# Patient Record
Sex: Female | Born: 1995 | Race: White | Hispanic: No | Smoking: Never smoker
Health system: Southern US, Community
[De-identification: ages and names within clinical notes are randomized; demographics above are authoritative.]

---
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
## 2022-05-28 NOTE — L&D Delivery Note (Signed)
Delivery Note  First Stage: Labor onset: 0030 Augmentation : none Analgesia /Anesthesia intrapartum: epidural SROM at 0030  Second Stage: Complete dilation at 0613 Onset of pushing at 0615 FHR second stage 130-140bpm, moderate variability, decelerations heard with contractions with good recovery after the contraction.  Delivery of a viable female infant 04/11/2023 at 0708 by Donato Schultz, CNM. delivery of fetal head in OA position with restitution to ROA. Loose nuchal cord x 3 unwrapped with delivery of the head;  Anterior then posterior shoulders delivered easily with gentle downward traction. Baby placed on mom's chest, and attended to by peds. Terminal meconium present. Cord double clamped after cessation of pulsation, cut by FOB Cord blood sample collected   Third Stage: Placenta delivered Tomasa Blase intact with 3 VC @ 0710 Placenta disposition: discarded Uterine tone firm / bleeding scant  1st degree laceration identified  Anesthesia for repair: epidural Repair 2-0 Vicryl Est. Blood Loss (mL):  Complications: none  Mom to postpartum.  Baby to Couplet care / Skin to Skin.  Newborn: Birth Weight: TBD, infant skin-to-skin  Apgar Scores: 8, 9 Feeding planned: breastfeeding

## 2023-03-23 ENCOUNTER — Encounter: Payer: Self-pay | Admitting: Obstetrics and Gynecology

## 2023-03-23 ENCOUNTER — Observation Stay
Admission: EM | Admit: 2023-03-23 | Discharge: 2023-03-23 | Disposition: A | Discharge status: Home / Self Care | Payer: Managed Care, Other (non HMO) | Attending: Obstetrics and Gynecology | Admitting: Obstetrics and Gynecology

## 2023-03-23 ENCOUNTER — Other Ambulatory Visit: Payer: Self-pay

## 2023-03-23 DIAGNOSIS — O4703 False labor before 37 completed weeks of gestation, third trimester: Secondary | ICD-10-CM | POA: Diagnosis not present

## 2023-03-23 DIAGNOSIS — Z3A36 36 weeks gestation of pregnancy: Secondary | ICD-10-CM | POA: Diagnosis not present

## 2023-03-23 DIAGNOSIS — O26893 Other specified pregnancy related conditions, third trimester: Secondary | ICD-10-CM | POA: Diagnosis present

## 2023-03-23 DIAGNOSIS — O4193X0 Disorder of amniotic fluid and membranes, unspecified, third trimester, not applicable or unspecified: Secondary | ICD-10-CM | POA: Diagnosis not present

## 2023-03-23 DIAGNOSIS — O23593 Infection of other part of genital tract in pregnancy, third trimester: Secondary | ICD-10-CM | POA: Diagnosis present

## 2023-03-23 DIAGNOSIS — N898 Other specified noninflammatory disorders of vagina: Principal | ICD-10-CM | POA: Diagnosis present

## 2023-03-23 LAB — WET PREP, GENITAL
Clue Cells Wet Prep HPF POC: NONE SEEN
Sperm: NONE SEEN
Trich, Wet Prep: NONE SEEN
WBC, Wet Prep HPF POC: <10 (ref <10)

## 2023-03-23 LAB — RUPTURE OF MEMBRANE (ROM)PLUS: Rom Plus: NEGATIVE

## 2023-03-23 MED ORDER — FLUCONAZOLE 50 MG PO TABS
150.0000 mg | ORAL_TABLET | Freq: Once | ORAL | Status: AC
  Administered 2023-03-23: 150 mg via ORAL
  Filled 2023-03-23: qty 1

## 2023-03-23 MED ORDER — CALCIUM CARBONATE ANTACID 500 MG PO CHEW: 2.0000 | CHEWABLE_TABLET | ORAL | Status: DC | PRN

## 2023-03-23 MED ORDER — ACETAMINOPHEN 325 MG PO TABS: 650.0000 mg | ORAL_TABLET | ORAL | Status: DC | PRN

## 2023-03-23 NOTE — Discharge Instructions (Signed)
LABOR: When contractions begin, you should start to time them from the beginning of one contraction to the beginning of the next.  When contractions are 5-10 minutes apart or less and have been regular for at least an hour, you should call your health care provider. ° °Notify your doctor if any of the following occur: °1. Bleeding from the vagina 7. Sudden, constant, or occasional abdominal pain  °2. Pain or burning when urinating 8. Sudden gushing of fluid from the vagina (with or without continued leaking)  °3. Chills or fever 9. Fainting spells, "black outs" or loss of consciousness  °4. Increase in vaginal discharge 10. Severe or continued nausea or vomiting  °5. Pelvic pressure (sudden increase) 11. Blurring of vision or spots before the eyes  °6. Baby moving less than usual 12. Leaking of fluid  ° ° °FETAL KICK COUNT: °Lie on your left side for one hour after a meal, and count the number of times your baby kicks. If it is less than 5 times, get up, move around and drink some juice. Repeat the test 30 minutes later. If it is still less than 5 kicks in an hour, notify your doctor. °

## 2023-03-23 NOTE — OB Triage Note (Signed)
Patient is a G2P0, [redacted]w[redacted]d per patient. Patient presents with complaint of leaking of fluid. Patient states that she was at [employer] and there was leaking fluid and her pants were wet. Patient states no bleeding. Patient feels some cramping but denies ctx. Patient reports +FM. VSS. Monitors applied and assessing.

## 2023-03-23 NOTE — Discharge Summary (Signed)
Krystal Torres is a 27 y.o. female. She is at [redacted]w[redacted]d gestation. No LMP recorded. Patient is pregnant. Estimated Date of Delivery: 04/17/23  Prenatal care site: [facility]    Current pregnancy complicated by:  - transferred to [facility] @ 36wks - Anemia  Chief complaint: vaginal discharge, possible SROM  She reports this evening she noticed leaking fluid and her pants were wet. She reports occasional mild contractions. She did not feel a "pop" with the leaking fluid and she is not continuing to leak fluid.  S: Resting comfortably. no CTX, no VB.no LOF,  Active fetal movement.  Denies: HA, visual changes, SOB, or RUQ/epigastric pain  Maternal Medical History:  History reviewed. No pertinent past medical history.  History reviewed. No pertinent surgical history.  No Known Allergies  Prior to Admission medications   Medication Sig Start Date End Date Taking? Authorizing Provider  HYDROcodone-acetaminophen (NORCO/VICODIN) 5-325 MG per tablet Take 1 tablet by mouth every 4 (four) hours as needed. Patient not taking: Reported on 03/23/2023 12/26/13   Elpidio Anis, PA-C  ibuprofen (ADVIL,MOTRIN) 600 MG tablet Take 1 tablet (600 mg total) by mouth every 6 (six) hours as needed. Patient not taking: Reported on 03/23/2023 12/26/13   Elpidio Anis, PA-C    Social History: She  reports that she has never smoked. She does not have any smokeless tobacco history on file. She reports that she does not drink alcohol and does not use drugs.  Family History: family history is not on file.  no history of gyn cancers  Review of Systems: A full review of systems was performed and negative except as noted in the HPI.     O:  BP 123/75 (BP Location: Left Arm)   Pulse (!) 121   Temp 98.2 F (36.8 C) (Oral)   Resp 16   Ht 5\' 7"  (1.702 m)   Wt 74.8 kg   BMI 25.84 kg/m  Results for orders placed or performed during the hospital encounter of 03/23/23 (from the past 48 hour(s))  Wet prep,  genital   Collection Time: 03/23/23  4:14 PM   Specimen: Vaginal  Result Value Ref Range   Yeast Wet Prep HPF POC PRESENT (A) NONE SEEN   Trich, Wet Prep NONE SEEN NONE SEEN   Clue Cells Wet Prep HPF POC NONE SEEN NONE SEEN   WBC, Wet Prep HPF POC <10 <10   Sperm NONE SEEN   Rupture of Membrane (ROM) Plus   Collection Time: 03/23/23  4:14 PM  Result Value Ref Range   Rom Plus NEGATIVE      Constitutional: NAD, AAOx3  HE/ENT: extraocular movements grossly intact, moist mucous membranes CV: RRR PULM: nl respiratory effort, CTABL     Abd: gravid, non-tender, non-distended, soft      Ext: Non-tender, Nonedematous   Psych: mood appropriate, speech normal Pelvic: deferred   Fetal  monitoring: Cat 1 Appropriate for GA Baseline: 130bpm Variability: moderate Accelerations: present x >2 Decelerations absent Contractions q3-17min, palpate mild  A/P: 27 y.o. [redacted]w[redacted]d here for antenatal surveillance for vaginal discharge  Principle Diagnosis:  Yeast infection  SROM: not present. ROM Plus negative. Reviewed s/s of SROM and when to return to the hospital. Vaginal yeast infection present. Treat with Diflucan 150mg  PO x 1. Fetal Wellbeing: Reassuring Cat 1 tracing. Reactive NST  D/c home stable, precautions reviewed, follow-up as scheduled.    Janyce Llanos, CNM 03/23/2023 5:34 PM

## 2023-04-11 ENCOUNTER — Inpatient Hospital Stay
Admission: EM | Admit: 2023-04-11 | Discharge: 2023-04-12 | DRG: 807 | Disposition: A | Discharge status: Home / Self Care | Payer: Managed Care, Other (non HMO) | Attending: Obstetrics | Admitting: Obstetrics

## 2023-04-11 ENCOUNTER — Encounter: Payer: Self-pay | Admitting: Obstetrics and Gynecology

## 2023-04-11 ENCOUNTER — Inpatient Hospital Stay: Payer: Managed Care, Other (non HMO) | Admitting: Anesthesiology

## 2023-04-11 ENCOUNTER — Other Ambulatory Visit: Payer: Self-pay

## 2023-04-11 DIAGNOSIS — O4292 Full-term premature rupture of membranes, unspecified as to length of time between rupture and onset of labor: Principal | ICD-10-CM | POA: Diagnosis present

## 2023-04-11 DIAGNOSIS — Z6791 Unspecified blood type, Rh negative: Secondary | ICD-10-CM | POA: Diagnosis not present

## 2023-04-11 DIAGNOSIS — O26899 Other specified pregnancy related conditions, unspecified trimester: Secondary | ICD-10-CM

## 2023-04-11 DIAGNOSIS — B951 Streptococcus, group B, as the cause of diseases classified elsewhere: Secondary | ICD-10-CM

## 2023-04-11 DIAGNOSIS — O4290 Premature rupture of membranes, unspecified as to length of time between rupture and onset of labor, unspecified weeks of gestation: Secondary | ICD-10-CM | POA: Diagnosis present

## 2023-04-11 DIAGNOSIS — Z3A39 39 weeks gestation of pregnancy: Secondary | ICD-10-CM | POA: Diagnosis not present

## 2023-04-11 DIAGNOSIS — Z23 Encounter for immunization: Secondary | ICD-10-CM

## 2023-04-11 DIAGNOSIS — O9902 Anemia complicating childbirth: Secondary | ICD-10-CM | POA: Diagnosis present

## 2023-04-11 DIAGNOSIS — O99824 Streptococcus B carrier state complicating childbirth: Secondary | ICD-10-CM | POA: Diagnosis present

## 2023-04-11 DIAGNOSIS — O99019 Anemia complicating pregnancy, unspecified trimester: Secondary | ICD-10-CM

## 2023-04-11 DIAGNOSIS — O26893 Other specified pregnancy related conditions, third trimester: Secondary | ICD-10-CM | POA: Diagnosis present

## 2023-04-11 LAB — COMPREHENSIVE METABOLIC PANEL
ALT: 13 U/L (ref 0–44)
AST: 22 U/L (ref 15–41)
Albumin: 2.6 g/dL — ABNORMAL LOW (ref 3.5–5.0)
Alkaline Phosphatase: 162 U/L — ABNORMAL HIGH (ref 38–126)
Anion gap: 9 (ref 5–15)
BUN: 8 mg/dL (ref 6–20)
CO2: 20 mmol/L — ABNORMAL LOW (ref 22–32)
Calcium: 8.8 mg/dL — ABNORMAL LOW (ref 8.9–10.3)
Chloride: 106 mmol/L (ref 98–111)
Creatinine, Ser: 0.57 mg/dL (ref 0.44–1.00)
GFR, Estimated: >60 mL/min (ref >60)
Glucose, Bld: 112 mg/dL — ABNORMAL HIGH (ref 70–99)
Potassium: 3.2 mmol/L — ABNORMAL LOW (ref 3.5–5.1)
Sodium: 135 mmol/L (ref 135–145)
Total Bilirubin: 0.4 mg/dL (ref <1.2)
Total Protein: 6.1 g/dL — ABNORMAL LOW (ref 6.5–8.1)

## 2023-04-11 LAB — CBC
HCT: 32.5 % — ABNORMAL LOW (ref 36.0–46.0)
Hemoglobin: 10.4 g/dL — ABNORMAL LOW (ref 12.0–15.0)
MCH: 25.6 pg — ABNORMAL LOW (ref 26.0–34.0)
MCHC: 32 g/dL (ref 30.0–36.0)
MCV: 80 fL (ref 80.0–100.0)
Platelets: 397 10*3/uL (ref 150–400)
RBC: 4.06 MIL/uL (ref 3.87–5.11)
RDW: 15 % (ref 11.5–15.5)
WBC: 10.4 10*3/uL (ref 4.0–10.5)
nRBC: 0 % (ref 0.0–0.2)

## 2023-04-11 LAB — PROTEIN / CREATININE RATIO, URINE
Creatinine, Urine: 24 mg/dL
Creatinine, Urine: 51 mg/dL
Protein Creatinine Ratio: 1.54 mg/mg{creat} — ABNORMAL HIGH (ref 0.00–0.15)
Total Protein, Urine: 37 mg/dL
Total Protein, Urine: <6 mg/dL

## 2023-04-11 LAB — TYPE AND SCREEN
ABO/RH(D): O NEG
Antibody Screen: NEGATIVE

## 2023-04-11 LAB — RPR: RPR Ser Ql: NONREACTIVE

## 2023-04-11 LAB — ABO/RH: ABO/RH(D): O NEG

## 2023-04-11 MED ORDER — POTASSIUM CHLORIDE 20 MEQ PO PACK
20.0000 meq | PACK | Freq: Two times a day (BID) | ORAL | Status: DC
  Administered 2023-04-11 – 2023-04-12 (×2): 20 meq via ORAL
  Filled 2023-04-11 (×2): qty 1

## 2023-04-11 MED ORDER — ZOLPIDEM TARTRATE 5 MG PO TABS: 5.0000 mg | ORAL_TABLET | Freq: Every evening | ORAL | Status: DC | PRN

## 2023-04-11 MED ORDER — EPHEDRINE 5 MG/ML INJ: 10.0000 mg | INTRAVENOUS | Status: DC | PRN

## 2023-04-11 MED ORDER — OXYTOCIN 10 UNIT/ML IJ SOLN
INTRAMUSCULAR | Status: AC
  Filled 2023-04-11: qty 2

## 2023-04-11 MED ORDER — LIDOCAINE HCL (PF) 1 % IJ SOLN: 30.0000 mL | INTRAMUSCULAR | Status: DC | PRN

## 2023-04-11 MED ORDER — FENTANYL-BUPIVACAINE-NACL 0.5-0.125-0.9 MG/250ML-% EP SOLN
EPIDURAL | Status: AC
  Filled 2023-04-11: qty 250

## 2023-04-11 MED ORDER — LACTATED RINGERS IV SOLN: 500.0000 mL | INTRAVENOUS | Status: DC | PRN

## 2023-04-11 MED ORDER — OXYTOCIN-SODIUM CHLORIDE 30-0.9 UT/500ML-% IV SOLN
2.5000 [IU]/h | INTRAVENOUS | Status: DC
  Administered 2023-04-11: 2.5 [IU]/h via INTRAVENOUS
  Filled 2023-04-11: qty 500

## 2023-04-11 MED ORDER — DIBUCAINE (PERIANAL) 1 % EX OINT
1.0000 | TOPICAL_OINTMENT | CUTANEOUS | Status: DC | PRN
  Administered 2023-04-11: 1 via RECTAL
  Filled 2023-04-11: qty 28

## 2023-04-11 MED ORDER — PRENATAL MULTIVITAMIN CH
1.0000 | ORAL_TABLET | Freq: Every day | ORAL | Status: DC
  Administered 2023-04-11 – 2023-04-12 (×2): 1 via ORAL
  Filled 2023-04-11 (×2): qty 1

## 2023-04-11 MED ORDER — PHENYLEPHRINE 80 MCG/ML (10ML) SYRINGE FOR IV PUSH (FOR BLOOD PRESSURE SUPPORT): 80.0000 ug | PREFILLED_SYRINGE | INTRAVENOUS | Status: DC | PRN

## 2023-04-11 MED ORDER — SIMETHICONE 80 MG PO CHEW: 80.0000 mg | CHEWABLE_TABLET | ORAL | Status: DC | PRN

## 2023-04-11 MED ORDER — SODIUM CHLORIDE 0.9 % IV SOLN
INTRAVENOUS | Status: AC
  Filled 2023-04-11: qty 2000

## 2023-04-11 MED ORDER — WITCH HAZEL-GLYCERIN EX PADS
1.0000 | MEDICATED_PAD | CUTANEOUS | Status: DC | PRN
  Administered 2023-04-11: 1 via TOPICAL
  Filled 2023-04-11: qty 100

## 2023-04-11 MED ORDER — COCONUT OIL OIL: 1.0000 | TOPICAL_OIL | Status: DC | PRN

## 2023-04-11 MED ORDER — ACETAMINOPHEN 325 MG PO TABS: 650.0000 mg | ORAL_TABLET | ORAL | Status: DC | PRN

## 2023-04-11 MED ORDER — SOD CITRATE-CITRIC ACID 500-334 MG/5ML PO SOLN: 30.0000 mL | ORAL | Status: DC | PRN

## 2023-04-11 MED ORDER — DIPHENHYDRAMINE HCL 50 MG/ML IJ SOLN: 12.5000 mg | INTRAMUSCULAR | Status: DC | PRN

## 2023-04-11 MED ORDER — ONDANSETRON HCL 4 MG/2ML IJ SOLN: 4.0000 mg | Freq: Four times a day (QID) | INTRAMUSCULAR | Status: DC | PRN

## 2023-04-11 MED ORDER — AMMONIA AROMATIC IN INHA
RESPIRATORY_TRACT | Status: AC
  Filled 2023-04-11: qty 10

## 2023-04-11 MED ORDER — LACTATED RINGERS IV SOLN: INTRAVENOUS | Status: DC

## 2023-04-11 MED ORDER — FENTANYL-BUPIVACAINE-NACL 0.5-0.125-0.9 MG/250ML-% EP SOLN
12.0000 mL/h | EPIDURAL | Status: DC | PRN
Start: 2023-04-11 — End: 2023-04-11
  Administered 2023-04-11: 12 mL/h via EPIDURAL

## 2023-04-11 MED ORDER — LIDOCAINE HCL (PF) 1 % IJ SOLN
INTRAMUSCULAR | Status: AC
  Filled 2023-04-11: qty 30

## 2023-04-11 MED ORDER — FENTANYL CITRATE (PF) 100 MCG/2ML IJ SOLN: 50.0000 ug | INTRAMUSCULAR | Status: DC | PRN

## 2023-04-11 MED ORDER — SODIUM CHLORIDE 0.9 % IV SOLN
5.0000 10*6.[IU] | Freq: Once | INTRAVENOUS | Status: DC
  Filled 2023-04-11: qty 5

## 2023-04-11 MED ORDER — ONDANSETRON HCL 4 MG/2ML IJ SOLN: 4.0000 mg | INTRAMUSCULAR | Status: DC | PRN

## 2023-04-11 MED ORDER — MISOPROSTOL 200 MCG PO TABS
ORAL_TABLET | ORAL | Status: AC
  Filled 2023-04-11: qty 4

## 2023-04-11 MED ORDER — BENZOCAINE-MENTHOL 20-0.5 % EX AERO
1.0000 | INHALATION_SPRAY | CUTANEOUS | Status: DC | PRN
  Administered 2023-04-11: 1 via TOPICAL
  Filled 2023-04-11: qty 56

## 2023-04-11 MED ORDER — ONDANSETRON HCL 4 MG PO TABS: 4.0000 mg | ORAL_TABLET | ORAL | Status: DC | PRN

## 2023-04-11 MED ORDER — LACTATED RINGERS IV SOLN: 500.0000 mL | Freq: Once | INTRAVENOUS | Status: DC

## 2023-04-11 MED ORDER — SODIUM CHLORIDE 0.9 % IV SOLN: 2.0000 g | Freq: Four times a day (QID) | INTRAVENOUS | Status: DC

## 2023-04-11 MED ORDER — SENNOSIDES-DOCUSATE SODIUM 8.6-50 MG PO TABS
2.0000 | ORAL_TABLET | Freq: Every day | ORAL | Status: DC
  Administered 2023-04-12: 2 via ORAL
  Filled 2023-04-11: qty 2

## 2023-04-11 MED ORDER — LIDOCAINE HCL (PF) 1 % IJ SOLN
INTRAMUSCULAR | Status: DC | PRN
  Administered 2023-04-11: 1 mL via SUBCUTANEOUS

## 2023-04-11 MED ORDER — LIDOCAINE-EPINEPHRINE (PF) 1.5 %-1:200000 IJ SOLN
INTRAMUSCULAR | Status: DC | PRN
  Administered 2023-04-11: 3 mL via EPIDURAL

## 2023-04-11 MED ORDER — OXYTOCIN BOLUS FROM INFUSION
333.0000 mL | Freq: Once | INTRAVENOUS | Status: AC
  Administered 2023-04-11: 333 mL via INTRAVENOUS

## 2023-04-11 MED ORDER — IBUPROFEN 600 MG PO TABS
600.0000 mg | ORAL_TABLET | Freq: Four times a day (QID) | ORAL | Status: DC
  Administered 2023-04-11 – 2023-04-12 (×5): 600 mg via ORAL
  Filled 2023-04-11 (×5): qty 1

## 2023-04-11 MED ORDER — PENICILLIN G POT IN DEXTROSE 60000 UNIT/ML IV SOLN
3.0000 10*6.[IU] | INTRAVENOUS | Status: DC
Start: 2023-04-11 — End: 2023-04-11

## 2023-04-11 MED ORDER — DIPHENHYDRAMINE HCL 25 MG PO CAPS: 25.0000 mg | ORAL_CAPSULE | Freq: Four times a day (QID) | ORAL | Status: DC | PRN

## 2023-04-11 NOTE — Progress Notes (Signed)
Repeat P/C ratio ordered since first sample was not straight cath. Result reported to [facility]. Vitals WNL.

## 2023-04-11 NOTE — Lactation Note (Signed)
This note was copied from a baby's chart. Lactation Consultation Note  Patient Name: Boy Delvia Asano [id number] Date: 04/11/2023 Age:25 hours Reason for consult: Initial assessment;Primapara;Term   Maternal Data Has patient been taught Hand Expression?: No Does the patient have breastfeeding experience prior to this delivery?: No  Lactation to room for an initial assessment w/ a P1 patient and 7hr old baby boy.  This was SVD.  Feeding goal is breastfeeding.    Patient stated that she has a Medela  Pump in Style at home.  Per patient, she feels feedings are going well. She does not feel comfortable with the football hold and states cradle hold she feels more comfortable with.  Feeding Mother's Current Feeding Choice: Breast Milk  Upon entry into room patient was breastfeeding infant on the rt breast.  Patient stated that she has not felt any pain.  During this time patient discussed how lips should look at the breast and how infants position should look, tummy to tummy. Patient verbalized understanding.  LATCH Score Latch: Grasps breast easily, tongue down, lips flanged, rhythmical sucking.  Audible Swallowing: A few with stimulation  Type of Nipple: Everted at rest and after stimulation  Comfort (Breast/Nipple): Soft / non-tender  Hold (Positioning): No assistance needed to correctly position infant at breast.  LATCH Score: 9  Interventions Interventions: Breast feeding basics reviewed;Assisted with latch;Education  LC provided education on the following;  milk production expectations, hunger cues, day 1/2 wet/dirty diapers, hand expression, benefits of STS and arousing infant for a feeding.  Lactation informed patient of feeding infant at least 8 or more times w/in a 24hr period but not exceeding 3hrs. Patient verbalized understanding.     Discharge Pump: Personal;Hands Argusta Mcgann (Medela Pump in Style)  Consult Status Consult Status: Follow-up Follow-up type:  In-patient    Yvette Rack Zyair Rhein 04/11/2023, 4:25 PM

## 2023-04-11 NOTE — Anesthesia Procedure Notes (Signed)
Epidural Patient location during procedure: OB Start time: 04/11/2023 2:32 AM End time: 04/11/2023 2:46 AM  Staffing Anesthesiologist: Foye Deer, MD Performed: anesthesiologist   Preanesthetic Checklist Completed: patient identified, IV checked, site marked, risks and benefits discussed, surgical consent, monitors and equipment checked, pre-op evaluation and timeout performed  Epidural Patient position: sitting Prep: ChloraPrep Patient monitoring: heart rate, continuous pulse ox and blood pressure Approach: midline Location: L3-L4 Injection technique: LOR saline  Needle:  Needle type: Tuohy  Needle gauge: 18 G Needle length: 9 cm Needle insertion depth: 5 cm Catheter type: closed end Catheter size: 20 Guage Catheter at skin depth: 10 cm Test dose: negative and 1.5% lidocaine with Epi 1:200 K  Assessment Events: blood not aspirated, no cerebrospinal fluid, injection not painful, no injection resistance and no paresthesia  Additional Notes Reason for block:procedure for pain

## 2023-04-11 NOTE — Progress Notes (Signed)
Mild range blood pressures, no headaches, changes of vision, or RUQ pain.  Vitals:   04/11/23 0247 04/11/23 0257 04/11/23 0259 04/11/23 0301  BP: (!) 140/93 135/81 127/89 129/81   04/11/23 0303 04/11/23 0311 04/11/23 0313 04/11/23 0315  BP: 121/77 127/89 (!) 142/125 129/84   04/11/23 0317 04/11/23 0321 04/11/23 0326 04/11/23 0328  BP: 118/86 (!) 134/91 (!) 142/48 30/54   27 year old G2P0010, NSVD @ 0708, has had several mild range blood pressures  Ordered PIH labs, CBC WNL (platelets 397), CMP and P/C ratio added. Will continue monitoring to determine if gHTN or pre-eclampsia or if needs antihypertensive medications.  Janyce Llanos, CNM 04/11/2023 8:09 AM

## 2023-04-11 NOTE — H&P (Signed)
OB History & Physical   History of Present Illness:  Chief Complaint:   HPI:  Krystal Torres is a 27 y.o. G73P0010 female at [redacted]w[redacted]d dated by LMP c/w [redacted]w[redacted]d [place].  She presents to L&D for SROM @ 0030 and uterine contractions. She started noticing contractions that started right around the time her water broke.  She endorses good fetal movement.    Pregnancy Issues: 1. Transferred to [facility] @ 36wks 2. Anemia 3. GBS positive 4. Rh negative   Maternal Medical History:  History reviewed. No pertinent past medical history.  History reviewed. No pertinent surgical history.  No Known Allergies  Prior to Admission medications   Medication Sig Start Date End Date Taking? Authorizing Provider  ferrous sulfate 325 (65 FE) MG tablet Take 325 mg by mouth daily with breakfast.   Yes [provider]  HYDROcodone-acetaminophen (NORCO/VICODIN) 5-325 MG per tablet Take 1 tablet by mouth every 4 (four) hours as needed. Patient not taking: Reported on 03/23/2023 12/26/13   Elpidio Anis, PA-C  ibuprofen (ADVIL,MOTRIN) 600 MG tablet Take 1 tablet (600 mg total) by mouth every 6 (six) hours as needed. Patient not taking: Reported on 03/23/2023 12/26/13   Elpidio Anis, PA-C     Prenatal care site: [facility]    Social History: She  reports that she has never smoked. She does not have any smokeless tobacco history on file. She reports that she does not drink alcohol and does not use drugs.  Family History: family history is not on file.   Review of Systems: A full review of systems was performed and negative except as noted in the HPI.     Physical Exam:  Vital Signs: BP (!) 145/95 (BP Location: Right Arm)   Pulse 91   Temp 97.9 F (36.6 C) (Oral)   Resp 16   Ht 5\' 7"  (1.702 m)   Wt 76.7 kg   BMI 26.47 kg/m  General: no acute distress.  HEENT: normocephalic, atraumatic Heart: regular rate & rhythm.  No murmurs/rubs/gallops Lungs: clear to auscultation bilaterally, normal  respiratory effort Abdomen: soft, gravid, non-tender;  EFW: 7.5lb Pelvic:   External: Normal external female genitalia  Cervix: Dilation: 4 / Effacement (%): 80 / Station: 0    Extremities: non-tender, symmetric, mild edema bilaterally.  DTRs: +2  Neurologic: Alert & oriented x 3.    No results found for this or any previous visit (from the past 24 hour(s)).  Pertinent Results:  Prenatal Labs: Blood type/Rh O negative  Antibody screen neg  Rubella Immune  Varicella Not evaluated prenatally  RPR NR  HBsAg Neg  HIV NR  GC neg  Chlamydia neg  Genetic screening negative  1 hour GTT 99  3 hour GTT N/a  GBS positive   FHT: 130bpm, moderate variability, accelerations present, no decelerations, Category I tracing TOCO: uterine contractions q2-75min, palpate mild SVE:  Dilation: 4 / Effacement (%): 80 / Station: 0    Cephalic by leopolds  No results found.  Assessment:  Krystal Torres is a 27 y.o. G37P0010 female at [redacted]w[redacted]d with SROM and uterine contractions.   Plan:  1. Admit to Labor & Delivery; consents reviewed and obtained - Dr. Jean Rosenthal notified of admission  2. Fetal Well being  - Fetal Tracing: Category I tracing - Group B Streptococcus ppx indicated: GBS positive and SROM, begin treatment at this time - Presentation: vertex confirmed by SVE   3. Routine OB: - Prenatal labs reviewed, as above - Rh negative,  will check infant cord blood after delivery - CBC, T&S, RPR on admit - Clear fluids, IVF   4. Monitoring of Labor  -  Contractions q2-61min, external toco in place -  Pelvis adequate for trial of labor -  Plan for continuous fetal monitoring  -  Maternal pain control as desired; requesting regional anesthesia - Anticipate vaginal delivery  5. Post Partum Planning: - Infant feeding: breastfeeding - Contraception: Liletta IUD - Tdap: received 28wks - Flu: declined - RSV: received 03/20/23  Janyce Llanos, CNM 04/11/23 1:43 AM

## 2023-04-11 NOTE — OB Triage Note (Signed)
Krystal Torres 27 y.o. @[redacted]w[redacted]d  G2P0  presents to Labor & Delivery triage via wheelchair steered by ED staff reporting leaking of fluid and contractions starting at 0030. Reports fluid was clear with slight pink color. Pt is grossly ruptured. She endorses positive fetal movement. External FM and TOCO applied to non-tender abdomen. Initial FHR 130. Vital signs obtained and BP slightly elevated. Patient oriented to care environment including call bell and bed control use. Donato Schultz, CNM notified of patient's arrival. Plan to admit.

## 2023-04-11 NOTE — Progress Notes (Signed)
Labor Progress Note  Krystal Torres is a 27 y.o. G2P0010 at [redacted]w[redacted]d by LMP admitted for active labor, PROM  Subjective: she reports feeling well, slight rectal pressure  Objective: BP 127/85   Pulse 76   Temp 97.9 F (36.6 C) (Oral)   Resp 16   Ht 5\' 7"  (1.702 m)   Wt 76.7 kg   SpO2 97%   BMI 26.47 kg/m  Notable VS details: reviewed  Fetal Assessment: FHT:  FHR: 125 bpm, variability: moderate,  accelerations:  Present,  decelerations:  Absent Category/reactivity:  Category I UC:   regular, every 1.5-3 minutes SVE:    Dilation: 10cm  Effacement: 100%  Station:  +2  Consistency: soft  Position: anterior  Membrane status:SROM @ 0030 Amniotic color: clear  Labs: Lab Results  Component Value Date   WBC 10.4 04/11/2023   HGB 10.4 (L) 04/11/2023   HCT 32.5 (L) 04/11/2023   MCV 80.0 04/11/2023   PLT 397 04/11/2023    Assessment / Plan: Spontaneous labor, progressing normally  Labor:  10cm, beginning second stage, pushing with good maternal effort, fetal head visible on perineum with pushing efforts Preeclampsia:  labs stable Fetal Wellbeing:  Category I Pain Control:  Epidural I/D:   GBS positive, treated with ampicillin x 2 Anticipated MOD:  NSVD  Janyce Llanos, CNM 04/11/2023, 6:17 AM

## 2023-04-11 NOTE — Anesthesia Preprocedure Evaluation (Signed)
Anesthesia Evaluation  Patient identified by MRN, date of birth, ID band Patient awake    Reviewed: Allergy & Precautions, H&P , NPO status , Patient's Chart, lab work & pertinent test results  Airway Mallampati: II  TM Distance: >3 FB Neck ROM: full    Dental no notable dental hx.    Pulmonary neg pulmonary ROS   Pulmonary exam normal        Cardiovascular Exercise Tolerance: Good negative cardio ROS Normal cardiovascular exam     Neuro/Psych    GI/Hepatic negative GI ROS,,,  Endo/Other    Renal/GU   negative genitourinary   Musculoskeletal   Abdominal   Peds  Hematology negative hematology ROS (+)   Anesthesia Other Findings  BMI    Body Mass Index: 26.47 kg/m      Reproductive/Obstetrics (+) Pregnancy                              Anesthesia Physical Anesthesia Plan  ASA: 2  Anesthesia Plan: Epidural   Post-op Pain Management:    Induction:   PONV Risk Score and Plan:   Airway Management Planned:   Additional Equipment:   Intra-op Plan:   Post-operative Plan:   Informed Consent: I have reviewed the patients History and Physical, chart, labs and discussed the procedure including the risks, benefits and alternatives for the proposed anesthesia with the patient or authorized representative who has indicated his/her understanding and acceptance.       Plan Discussed with: Anesthesiologist and CRNA  Anesthesia Plan Comments:          Anesthesia Quick Evaluation

## 2023-04-11 NOTE — Discharge Summary (Deleted)
Postpartum Discharge Summary  Patient Name: Krystal Torres DOB: [birth year] MRN: [id number]  Date of admission: 04/11/2023 Delivery date:04/11/2023 Delivering provider: Donato Schultz RENEE Date of discharge: 04/12/2023  Primary OB: [facility] LMP:No LMP recorded. EDC Estimated Date of Delivery: 04/17/23 Gestational Age at Delivery: [redacted]w[redacted]d   Admitting diagnosis: Amniotic fluid leaking [O42.90] Intrauterine pregnancy: [redacted]w[redacted]d     Secondary diagnosis:   Principal Problem:   NSVD (normal spontaneous vaginal delivery) Active Problems:   Amniotic fluid leaking   Anemia affecting pregnancy   Rh negative state in antepartum period   Positive GBS test   Discharge Diagnosis: Term Pregnancy Delivered                                                Post partum procedures: None  Augmentation:: N/A Complications: None Delivery Type: spontaneous vaginal delivery Anesthesia: epidural anesthesia Placenta: spontaneous To Pathology: No  Laceration: 1st degree Episiotomy: none  Prenatal Labs:  Blood type/Rh O negative  Antibody screen neg  Rubella Immune  Varicella Not evaluated prenatally  RPR NR  HBsAg Neg  HIV NR  GC neg  Chlamydia neg  Genetic screening negative  1 hour GTT 99  3 hour GTT N/a  GBS positive    Hospital course: Onset of Labor With Vaginal Delivery      27 y.o. yo G2P1011 at [redacted]w[redacted]d was admitted in Active Labor on 04/11/2023. Labor course was uncomplicated. She progressed to 10/100/+2 and pushed 1 hour, delivering viable female infant over 1st degree laceration. Apgars 8/9.  Membrane Rupture Time/Date: 12:30 AM,04/11/2023  Delivery Method:Vaginal, Spontaneous Operative Delivery:N/A Episiotomy: None Lacerations:  1st degree Patient had an uncomplicated postpartum course. She is ambulating, tolerating a regular diet, passing flatus, and urinating well. Patient is discharged home in stable condition on 04/12/23.  Newborn Data: "Montez Morita" Birth  date:04/11/2023 Birth time:7:08 AM Gender:Female Living status:Living Apgars:8 ,9  Weight:3970 g  Magnesium Sulfate received: No BMZ received: No Rhophylac:Yes; ordered by CNM prior to discharge  MMR:No Varivax vaccine given: was not indicated T-DaP:Given prenatally Flu: N/A  Transfusion:No  Physical exam  Vitals:   04/11/23 1600 04/11/23 1944 04/11/23 2321 04/12/23 0809  BP: 118/86 121/86 115/80 106/71  Pulse: 88 86 91 100  Resp: 20 20 20    Temp: 98 F (36.7 C) 98.4 F (36.9 C) 98.3 F (36.8 C) 98.7 F (37.1 C)  TempSrc: Oral Oral Oral Oral  SpO2: 99% 99% 99% 99%  Weight:      Height:       General: alert, cooperative, and no distress Lochia: appropriate Uterine Fundus: firm Perineum:minimal edema/repair well approximated DVT Evaluation: No evidence of DVT seen on physical exam.  Labs: Lab Results  Component Value Date   WBC 10.4 04/12/2023   HGB 9.2 (L) 04/12/2023   HCT 28.9 (L) 04/12/2023   MCV 83.0 04/12/2023   PLT 285 04/12/2023      Latest Ref Rng & Units 04/11/2023    8:28 AM  CMP  Glucose 70 - 99 mg/dL 756   BUN 6 - 20 mg/dL 8   Creatinine 4.33 - 2.95 mg/dL 1.88   Sodium 416 - 606 mmol/L 135   Potassium 3.5 - 5.1 mmol/L 3.2   Chloride 98 - 111 mmol/L 106   CO2 22 - 32 mmol/L 20   Calcium 8.9 - 10.3 mg/dL 8.8   Total  Protein 6.5 - 8.1 g/dL 6.1   Total Bilirubin <4.0 mg/dL 0.4   Alkaline Phos 38 - 126 U/L 162   AST 15 - 41 U/L 22   ALT 0 - 44 U/L 13    Edinburgh Score:    04/11/2023    5:24 PM  Edinburgh Postnatal Depression Scale Screening Tool  I have been able to laugh and see the funny side of things. 0  I have looked forward with enjoyment to things. 0  I have blamed myself unnecessarily when things went wrong. 0  I have been anxious or worried for no good reason. 0  I have felt scared or panicky for no good reason. 0  Things have been getting on top of me. 0  I have been so unhappy that I have had difficulty sleeping. 0  I have  felt sad or miserable. 0  I have been so unhappy that I have been crying. 0  The thought of harming myself has occurred to me. 0  Edinburgh Postnatal Depression Scale Total 0    Risk assessment for postpartum VTE and prophylactic treatment: Very high risk factors: None High risk factors: None Moderate risk factors: None  Postpartum VTE prophylaxis with LMWH not indicated  After visit meds:  Allergies as of 04/12/2023   No Known Allergies      Medication List     STOP taking these medications    HYDROcodone-acetaminophen 5-325 MG tablet Commonly known as: NORCO/VICODIN       TAKE these medications    benzocaine-Menthol 20-0.5 % Aero Commonly known as: DERMOPLAST Apply 1 Application topically as needed for irritation (perineal discomfort).   coconut oil Oil Apply 1 Application topically as needed.   dibucaine 1 % Oint Commonly known as: NUPERCAINAL Place 1 Application rectally as needed for hemorrhoids.   ferrous sulfate 325 (65 FE) MG tablet Take 325 mg by mouth daily with breakfast.   ibuprofen 600 MG tablet Commonly known as: ADVIL Take 1 tablet (600 mg total) by mouth every 6 (six) hours. What changed:  when to take this reasons to take this   prenatal multivitamin Tabs tablet Take 1 tablet by mouth daily at 12 noon.   senna-docusate 8.6-50 MG tablet Commonly known as: Senokot-S Take 2 tablets by mouth daily.   witch hazel-glycerin pad Commonly known as: TUCKS Apply 1 Application topically as needed for hemorrhoids.       Discharge home in stable condition Infant Feeding: Breast Infant Disposition:home with mother Discharge instruction: per After Visit Summary and Postpartum booklet. Activity: Advance as tolerated. Pelvic rest for 6 weeks.  Diet: routine diet Anticipated Birth Control: IUD Liletta Postpartum Appointment:6 weeks Additional Postpartum F/U:  None  Future Appointments:No future appointments.  Follow-up Information      Janyce Llanos, CNM Follow up in 6 week(s).   Specialty: Certified Nurse Midwife Why: 6wk postpartum and IUD placement Contact information: [address] [place] [address] [phone]                 Plan:  Krystal Torres was discharged to home in good condition. Follow-up appointment as directed.    SignedRoney Jaffe, CNM 04/12/2023 8:58 AM

## 2023-04-12 LAB — CBC
HCT: 28.9 % — ABNORMAL LOW (ref 36.0–46.0)
Hemoglobin: 9.2 g/dL — ABNORMAL LOW (ref 12.0–15.0)
MCH: 26.4 pg (ref 26.0–34.0)
MCHC: 31.8 g/dL (ref 30.0–36.0)
MCV: 83 fL (ref 80.0–100.0)
Platelets: 285 10*3/uL (ref 150–400)
RBC: 3.48 MIL/uL — ABNORMAL LOW (ref 3.87–5.11)
RDW: 15.4 % (ref 11.5–15.5)
WBC: 10.4 10*3/uL (ref 4.0–10.5)
nRBC: 0 % (ref 0.0–0.2)

## 2023-04-12 LAB — FETAL SCREEN: Fetal Screen: NEGATIVE

## 2023-04-12 MED ORDER — RHO D IMMUNE GLOBULIN 1500 UNIT/2ML IJ SOSY
300.0000 ug | PREFILLED_SYRINGE | Freq: Once | INTRAMUSCULAR | Status: DC
  Filled 2023-04-12: qty 2

## 2023-04-12 MED ORDER — DIBUCAINE (PERIANAL) 1 % EX OINT: 1.0000 | TOPICAL_OINTMENT | CUTANEOUS | Status: AC | PRN

## 2023-04-12 MED ORDER — PRENATAL MULTIVITAMIN CH: 1.0000 | ORAL_TABLET | Freq: Every day | ORAL | Status: AC

## 2023-04-12 MED ORDER — WITCH HAZEL-GLYCERIN EX PADS: 1.0000 | MEDICATED_PAD | CUTANEOUS | 12 refills | Status: DC | PRN

## 2023-04-12 MED ORDER — SENNOSIDES-DOCUSATE SODIUM 8.6-50 MG PO TABS: 2.0000 | ORAL_TABLET | Freq: Every day | ORAL | Status: AC

## 2023-04-12 MED ORDER — WITCH HAZEL-GLYCERIN EX PADS: 1.0000 | MEDICATED_PAD | CUTANEOUS | Status: AC | PRN

## 2023-04-12 MED ORDER — COCONUT OIL OIL: 1.0000 | TOPICAL_OIL | Status: AC | PRN

## 2023-04-12 MED ORDER — BENZOCAINE-MENTHOL 20-0.5 % EX AERO: 1.0000 | INHALATION_SPRAY | CUTANEOUS | Status: AC | PRN

## 2023-04-12 MED ORDER — RHO D IMMUNE GLOBULIN 1500 UNIT/2ML IJ SOSY
300.0000 ug | PREFILLED_SYRINGE | Freq: Once | INTRAMUSCULAR | Status: AC
  Administered 2023-04-12: 300 ug via INTRAVENOUS
  Filled 2023-04-12: qty 2

## 2023-04-12 MED ORDER — IBUPROFEN 600 MG PO TABS: 600.0000 mg | ORAL_TABLET | Freq: Four times a day (QID) | ORAL | 0 refills | Status: AC

## 2023-04-12 NOTE — Progress Notes (Signed)
Pt discharged with infant.  Discharge instructions, prescriptions and follow up appointment given to and reviewed with pt. Pt verbalized understanding. Escorted out by auxillary. 

## 2023-04-12 NOTE — Discharge Summary (Signed)
Postpartum Discharge Summary    Patient Name: Krystal Torres DOB: 10/14/1995 MRN: [id number]  Date of admission: 04/11/2023 Delivery date:04/11/2023 Delivering provider: Donato Schultz RENEE Date of discharge: 04/12/2023  Admitting diagnosis: Amniotic fluid leaking [O42.90] Intrauterine pregnancy: [redacted]w[redacted]d     Secondary diagnosis:  Principal Problem:   NSVD (normal spontaneous vaginal delivery) Active Problems:   Amniotic fluid leaking   Anemia affecting pregnancy   Rh negative state in antepartum period   Positive GBS test   Discharge diagnosis: Term Pregnancy Delivered                                              Post partum procedures: None  Augmentation: N/A Complications: None  Hospital course: Onset of Labor With Vaginal Delivery      27 y.o. yo G2P1011 at [redacted]w[redacted]d was admitted in Active Labor on 04/11/2023. Labor course was uncomplicated. She progressed to 10/100/+2 and pushed 1 hour, delivering viable female infant over 1st degree laceration. Apgars 8/9. Membrane Rupture Time/Date: 12:30 AM,04/11/2023  Delivery Method:Vaginal, Spontaneous Operative Delivery:N/A Episiotomy: None Lacerations:  1st degree Patient had a uncomplicated postpartum course. She is ambulating, tolerating a regular diet, passing flatus, and urinating well. Patient is discharged home in stable condition on 04/12/23.  Newborn Data: Birth date:04/11/2023 Birth time:7:08 AM Gender:Female Living status:Living Apgars:8 ,9  Weight:3970 g  Magnesium Sulfate received: No BMZ received: No Rhophylac:Yes; ordered by CNM prior to discharge  MMR:No Varivax vaccine given: was not indicated  T-DaP: Given prenatally Flu: N/A RSV Vaccine received: Yes; given on 03/20/2023 Transfusion:No Immunizations administered:  There is no immunization history on file for this patient.  Physical exam  Vitals:   04/11/23 1600 04/11/23 1944 04/11/23 2321 04/12/23 0809  BP: 118/86 121/86 115/80 106/71  Pulse: 88  86 91 100  Resp: 20 20 20    Temp: 98 F (36.7 C) 98.4 F (36.9 C) 98.3 F (36.8 C) 98.7 F (37.1 C)  TempSrc: Oral Oral Oral Oral  SpO2: 99% 99% 99% 99%  Weight:      Height:       General: alert, cooperative, and no distress Lochia: appropriate Uterine Fundus: firm DVT Evaluation: No evidence of DVT seen on physical exam. Labs: Lab Results  Component Value Date   WBC 10.4 04/12/2023   HGB 9.2 (L) 04/12/2023   HCT 28.9 (L) 04/12/2023   MCV 83.0 04/12/2023   PLT 285 04/12/2023      Latest Ref Rng & Units 04/11/2023    8:28 AM  CMP  Glucose 70 - 99 mg/dL 657   BUN 6 - 20 mg/dL 8   Creatinine 8.46 - 9.62 mg/dL 9.52   Sodium 841 - 324 mmol/L 135   Potassium 3.5 - 5.1 mmol/L 3.2   Chloride 98 - 111 mmol/L 106   CO2 22 - 32 mmol/L 20   Calcium 8.9 - 10.3 mg/dL 8.8   Total Protein 6.5 - 8.1 g/dL 6.1   Total Bilirubin <4.0 mg/dL 0.4   Alkaline Phos 38 - 126 U/L 162   AST 15 - 41 U/L 22   ALT 0 - 44 U/L 13    Edinburgh Score:    04/11/2023    5:24 PM  Edinburgh Postnatal Depression Scale Screening Tool  I have been able to laugh and see the funny side of things. 0  I have looked  forward with enjoyment to things. 0  I have blamed myself unnecessarily when things went wrong. 0  I have been anxious or worried for no good reason. 0  I have felt scared or panicky for no good reason. 0  Things have been getting on top of me. 0  I have been so unhappy that I have had difficulty sleeping. 0  I have felt sad or miserable. 0  I have been so unhappy that I have been crying. 0  The thought of harming myself has occurred to me. 0  Edinburgh Postnatal Depression Scale Total 0      After visit meds:  Allergies as of 04/12/2023   No Known Allergies      Medication List     STOP taking these medications    HYDROcodone-acetaminophen 5-325 MG tablet Commonly known as: NORCO/VICODIN       TAKE these medications    benzocaine-Menthol 20-0.5 % Aero Commonly known  as: DERMOPLAST Apply 1 Application topically as needed for irritation (perineal discomfort).   coconut oil Oil Apply 1 Application topically as needed.   dibucaine 1 % Oint Commonly known as: NUPERCAINAL Place 1 Application rectally as needed for hemorrhoids.   ferrous sulfate 325 (65 FE) MG tablet Take 325 mg by mouth daily with breakfast.   ibuprofen 600 MG tablet Commonly known as: ADVIL Take 1 tablet (600 mg total) by mouth every 6 (six) hours. What changed:  when to take this reasons to take this   prenatal multivitamin Tabs tablet Take 1 tablet by mouth daily at 12 noon.   senna-docusate 8.6-50 MG tablet Commonly known as: Senokot-S Take 2 tablets by mouth daily.   witch hazel-glycerin pad Commonly known as: TUCKS Apply 1 Application topically as needed for hemorrhoids.         Discharge home in stable condition Infant Feeding: Breast Infant Disposition:home with mother Discharge instruction: per After Visit Summary and Postpartum booklet. Activity: Advance as tolerated. Pelvic rest for 6 weeks.  Diet: routine diet Anticipated Birth Control: IUD Liletta Postpartum Appointment:6 weeks Additional Postpartum F/U:  None    Follow-up Information     Janyce Llanos, CNM Follow up in 6 week(s).   Specialty: Certified Nurse Midwife Why: 6wk postpartum and IUD placement Contact information: [address] [place] [address] [phone]                     04/12/2023 Roney Jaffe, CNM

## 2023-04-12 NOTE — Anesthesia Postprocedure Evaluation (Signed)
Anesthesia Post Note  Patient: TALECIA GREUNKE  Procedure(s) Performed: AN AD HOC LABOR EPIDURAL  Patient location during evaluation: Mother Baby Anesthesia Type: Epidural Level of consciousness: awake and alert Pain management: pain level controlled Vital Signs Assessment: post-procedure vital signs reviewed and stable Respiratory status: spontaneous breathing, nonlabored ventilation and respiratory function stable Cardiovascular status: stable Postop Assessment: no headache, no backache and epidural receding Anesthetic complications: no   No notable events documented.   Last Vitals:  Vitals:   04/11/23 1944 04/11/23 2321  BP: 121/86 115/80  Pulse: 86 91  Resp: 20 20  Temp: 36.9 C 36.8 C  SpO2: 99% 99%    Last Pain:  Vitals:   04/12/23 0355  TempSrc:   PainSc: Asleep                 Rosanne Gutting

## 2023-04-12 NOTE — Lactation Note (Signed)
This note was copied from a baby's chart. Lactation Consultation Note  Patient Name: Krystal Torres [id number] Date: 04/12/2023 Age:25 hours    Lactation met w/ P1 patient and 27hr old baby Krystal.  Patient currently eating breakfast.    LC informed patient that she would return to discuss engorgement, breastmilk storage guidelines, and measure nipples if she desires. Encouraged patient to call out for lactation.    Yvette Rack Wells Mabe 04/12/2023, 10:12 AM

## 2023-04-13 LAB — RHOGAM INJECTION: Unit division: 0
